# Patient Record
Sex: Female | Born: 1985 | Race: White | Hispanic: No | Marital: Single | Smoking: Current every day smoker
Health system: Southern US, Community
[De-identification: ages and names within clinical notes are randomized; demographics above are authoritative.]

## PROBLEM LIST (undated history)

## (undated) ENCOUNTER — Ambulatory Visit: Admission: EM | Payer: Self-pay

---
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
## 2010-06-07 ENCOUNTER — Emergency Department (HOSPITAL_BASED_OUTPATIENT_CLINIC_OR_DEPARTMENT_OTHER)
Admission: EM | Admit: 2010-06-07 | Discharge: 2010-06-08 | Payer: Self-pay | Source: Home / Self Care | Admitting: Emergency Medicine

## 2011-02-13 NOTE — Discharge Summary (Signed)
[facility]  Patient:    Krystal Lambert, Krystal Lambert                         MRN: [id number] Adm. Date:  [id number] Disc. Date: [id number] Attending:  Milford Cage H                           Discharge Summary  PATIENT IDENTIFICATION:  Krystal Lambert was a 25 year old female.  HISTORY OF PRESENT ILLNESS:  Krystal Lambert was admitted to the hospital after being suspended from school for swearing at her teacher, she said.  She had been mad at the teacher because she had been given a slip to go to the principals office.  She said all that she did was left at the time, when the teacher did not want her to be laughing.  If she went to the office, she would be suspended, she said.  So, she used the F-word, the B-word, and other words, and got suspended.  She said, looking back on it, she knew all that she should have done, was to keep her mouth shut, go to the principals office, and she might have been out of trouble, and if she were in trouble, she said, she should have just taken it, because she deserved it.  She said she had been getting into some trouble at home.  Things had been worse since her mother made her move from living close to her grandmothers house to living just with her.  MENTAL STATUS EXAMINATION:  At the time of the initial evaluation, revealed an alert and oriented, young woman, who was cooperative.  She denied any suicidal thoughts or threats towards anyone else.  There was no evidence of any thought disorder.  Short and long-term memory were intact.  Judgement seemed adequate. Insight was minimal.  Intellectual functioning seemed, at least, average. Concentration was adequate.  Other pertinent history can be obtained from the psychosocial service summary.  The physical examination was within normal limits.  ADMISSION DIAGNOSES: Axis I:    1. Mood disorder, not otherwise specified.            2. Oppositional defiant disorder. Axis II:   Deferred. Axis III:   Healthy. Axis IV:   Moderate. Axis V:    50/55.  FINDINGS:  All indicated laboratory examinations were within normal limits or noncontributory.  HOSPITAL COURSE:  While in the hospital, Krystal Lambert was basically cooperative. She said problem had been her temper.  She knew she could control it if she worked at it.  She believes that she could work things out with her mother, if she made the effort to do it.  She did miss her grandmother, she said, because her grandmother was someone she could talk to and who seemed supportive to her.  From her standpoint, her mother always seemed busy with her own issues and her own life, and they clashed regularly.  She admitted that if she improved her own attitude, her mothers attitude might get better.  She did have a family session with her parents.  They let her know what they expected of her.  Her mother admitted to being moody, but was willing to work on her own moods at times.  The rules of returning to home were set up.  Krystal Lambert said she believed she could follow them, and she would make an effort to get along, both at home and school, because it was  not worth the trouble.  Consequently, she was discharged.  CONDITION AT DISCHARGE:  She was denying any threats towards herself or anyone else, and indicating that she would follow the rules and stay out of trouble at home and school.  POST HOSPITAL CARE PLAN:  She was referred to Rolan Lipa, at the [facility] in [place], with an appointment for May 3. There were no medications used in the hospital, nor were any prescribed at the time of discharge.  There were no restrictions placed on her activity or her diet.  FINAL DIAGNOSIS: Axis I:    1. Mood disorder, not otherwise specified.            2. Oppositional defiant disorder. Axis II:   No diagnosis. Axis III:  Healthy. Axis IV:   Moderate. Axis V:    50. DD:  02/04/00 TD:  02/06/00 Job: [id number]

## 2011-10-21 ENCOUNTER — Emergency Department (HOSPITAL_BASED_OUTPATIENT_CLINIC_OR_DEPARTMENT_OTHER)
Admission: EM | Admit: 2011-10-21 | Discharge: 2011-10-21 | Disposition: A | Discharge status: Home / Self Care | Payer: Medicaid Other | Attending: Emergency Medicine | Admitting: Emergency Medicine

## 2011-10-21 ENCOUNTER — Encounter (HOSPITAL_BASED_OUTPATIENT_CLINIC_OR_DEPARTMENT_OTHER): Payer: Self-pay | Admitting: *Deleted

## 2011-10-21 DIAGNOSIS — K029 Dental caries, unspecified: Secondary | ICD-10-CM | POA: Insufficient documentation

## 2011-10-21 DIAGNOSIS — F17200 Nicotine dependence, unspecified, uncomplicated: Secondary | ICD-10-CM | POA: Insufficient documentation

## 2011-10-21 DIAGNOSIS — K089 Disorder of teeth and supporting structures, unspecified: Secondary | ICD-10-CM | POA: Insufficient documentation

## 2011-10-21 DIAGNOSIS — K0889 Other specified disorders of teeth and supporting structures: Secondary | ICD-10-CM

## 2011-10-21 MED ORDER — OXYCODONE-ACETAMINOPHEN 5-325 MG PO TABS
1.0000 | ORAL_TABLET | Freq: Once | ORAL | Status: AC
  Administered 2011-10-21: 1 via ORAL
  Filled 2011-10-21: qty 1

## 2011-10-21 MED ORDER — OXYCODONE-ACETAMINOPHEN 5-325 MG PO TABS: 2.0000 | ORAL_TABLET | ORAL | Status: AC | PRN

## 2011-10-21 MED ORDER — AMOXICILLIN 500 MG PO CAPS: 500.0000 mg | ORAL_CAPSULE | Freq: Three times a day (TID) | ORAL | Status: AC

## 2011-10-21 NOTE — ED Provider Notes (Signed)
History     CSN: [id number]  Arrival date & time 10/21/11  2137   First MD Initiated Contact with Patient 10/21/11 2203      Chief Complaint  Patient presents with  . Dental Pain   patient has been having a toothache in the right lower molar over the past week. She's had no fevers no drainage. Denies any facial swelling. States she's trying to get into see a dentist. She denies any other symptoms at this time  (Consider location/radiation/quality/duration/timing/severity/associated sxs/prior treatment) HPI  History reviewed. No pertinent past medical history.  Past Surgical History  Procedure Date  . Cesarean section     History reviewed. No pertinent family history.  History  Substance Use Topics  . Smoking status: Current Everyday Smoker  . Smokeless tobacco: Not on file  . Alcohol Use: No    OB History    Grav Para Term Preterm Abortions TAB SAB Ect Mult Living                  Review of Systems  All other systems reviewed and are negative.    Allergies  Morphine and related  Home Medications   Current Outpatient Rx  Name Route Sig Dispense Refill  . ACETAMINOPHEN 500 MG PO TABS Oral Take 1,000 mg by mouth every 6 (six) hours as needed. For pain    . IBUPROFEN 200 MG PO TABS Oral Take 200 mg by mouth 2 (two) times daily. For pain    . AMOXICILLIN 500 MG PO CAPS Oral Take 1 capsule (500 mg total) by mouth 3 (three) times daily. 21 capsule 0  . OXYCODONE-ACETAMINOPHEN 5-325 MG PO TABS Oral Take 2 tablets by mouth every 4 (four) hours as needed for pain. 15 tablet 0    BP 135/66  Pulse 89  Temp(Src) 97.9 F (36.6 C) (Oral)  Resp 16  Ht 5\' 5"  (1.651 m)  Wt 150 lb (68.04 kg)  BMI 24.96 kg/m2  SpO2 100%  LMP 10/07/2011  Physical Exam  Nursing note and vitals reviewed. Constitutional: She appears well-developed and well-nourished. No distress.  HENT:  Head: Normocephalic.  Mouth/Throat: No oropharyngeal exudate.       Dental caries in right lower  molar. No surrounding erythema or redness. No abscess. There is no abnormal odor  Eyes: Pupils are equal, round, and reactive to light.  Cardiovascular: Normal heart sounds.   Pulmonary/Chest: Breath sounds normal.  Abdominal: Soft.  Musculoskeletal: Normal range of motion.  Lymphadenopathy:    She has no cervical adenopathy.  Neurological: She is alert.  Skin: Skin is warm and dry.    ED Course  Procedures (including critical care time)  Labs Reviewed - No data to display No results found.   1. Pain, dental   2. Dental caries       MDM  Patient is seen and examined, initial history and physical is completed. Evaluation initiated        Caelin Rosen A. Patrica Duel, MD 10/21/11 2215

## 2011-10-21 NOTE — ED Notes (Signed)
Pt c/o right lower tooth ache 

## 2012-03-20 ENCOUNTER — Emergency Department (HOSPITAL_BASED_OUTPATIENT_CLINIC_OR_DEPARTMENT_OTHER)
Admission: EM | Admit: 2012-03-20 | Discharge: 2012-03-20 | Disposition: A | Discharge status: Home / Self Care | Payer: Medicaid Other | Attending: Emergency Medicine | Admitting: Emergency Medicine

## 2012-03-20 ENCOUNTER — Emergency Department (HOSPITAL_BASED_OUTPATIENT_CLINIC_OR_DEPARTMENT_OTHER): Payer: Medicaid Other

## 2012-03-20 ENCOUNTER — Encounter (HOSPITAL_BASED_OUTPATIENT_CLINIC_OR_DEPARTMENT_OTHER): Payer: Self-pay | Admitting: *Deleted

## 2012-03-20 DIAGNOSIS — F17200 Nicotine dependence, unspecified, uncomplicated: Secondary | ICD-10-CM | POA: Insufficient documentation

## 2012-03-20 DIAGNOSIS — Z885 Allergy status to narcotic agent status: Secondary | ICD-10-CM | POA: Insufficient documentation

## 2012-03-20 DIAGNOSIS — R079 Chest pain, unspecified: Secondary | ICD-10-CM | POA: Insufficient documentation

## 2012-03-20 DIAGNOSIS — R0602 Shortness of breath: Secondary | ICD-10-CM | POA: Insufficient documentation

## 2012-03-20 MED ORDER — NAPROXEN 500 MG PO TABS: 500.0000 mg | ORAL_TABLET | Freq: Two times a day (BID) | ORAL | Status: AC

## 2012-03-20 NOTE — ED Notes (Signed)
Pt states she has had intermittent episodes of CP and SHOB over the past 2 weeks. Her father told her it was due to the window units in the house "they can put moisture in your lungs". No distress

## 2012-03-20 NOTE — ED Provider Notes (Signed)
History   This chart was scribed for Rolan Bucco, MD by Charolett Bumpers . The patient was seen in room MH04/MH04.    CSN: [id number]  Arrival date & time 03/20/12  2128   First MD Initiated Contact with Patient 03/20/12 2158      Chief Complaint  Patient presents with  . Chest Pain    (Consider location/radiation/quality/duration/timing/severity/associated sxs/prior treatment) HPI Karoline Lazalde is a 26 y.o. female who presents to the Emergency Department complaining of intermittent, moderate chest pain with associated mild SOB over the past 2 weeks. Patient states that her chest pain is all the way across her chest as well as across her upper back. Patient states that her chest pain is sharp. Patient states that her symptoms are aggravated with movement, sometimes breathing and sitting up. Patient states that the chest pain feels like a pressure in the front, and states the sharp pain are located in her back. Patient denies any recent injuries or travel. Patient denies any h/o heart problems in past. Patient reports no family h/o heart problems. Is concerned that she might have gotten some infection from the air conditioner.  No leg pain or swelling.  No cough or recent illnesses  History reviewed. No pertinent past medical history.  Past Surgical History  Procedure Date  . Cesarean section     History reviewed. No pertinent family history.  History  Substance Use Topics  . Smoking status: Current Everyday Smoker  . Smokeless tobacco: Not on file  . Alcohol Use: No    OB History    Grav Para Term Preterm Abortions TAB SAB Ect Mult Living                  Review of Systems  Constitutional: Negative for fever.  HENT: Negative for congestion, sore throat and rhinorrhea.   Respiratory: Positive for shortness of breath.   Cardiovascular: Positive for chest pain. Negative for leg swelling.  Gastrointestinal: Negative for nausea, vomiting, abdominal pain and diarrhea.    All other systems reviewed and are negative.    Allergies  Morphine and related  Home Medications   Current Outpatient Rx  Name Route Sig Dispense Refill  . ACETAMINOPHEN 500 MG PO TABS Oral Take 1,000 mg by mouth every 6 (six) hours as needed. For pain    . IBUPROFEN 200 MG PO TABS Oral Take 200 mg by mouth 2 (two) times daily. For pain    . NAPROXEN 500 MG PO TABS Oral Take 1 tablet (500 mg total) by mouth 2 (two) times daily. 30 tablet 0    BP 144/93  Pulse 86  Temp 98 F (36.7 C) (Oral)  Resp 20  Ht 5\' 5"  (1.651 m)  Wt 204 lb 2 oz (92.59 kg)  BMI 33.97 kg/m2  SpO2 99%  LMP 03/20/2012  Physical Exam  Nursing note and vitals reviewed. Constitutional: She is oriented to person, place, and time. She appears well-developed and well-nourished. No distress.  HENT:  Head: Normocephalic and atraumatic.  Mouth/Throat: Oropharynx is clear and moist.  Eyes: EOM are normal. Pupils are equal, round, and reactive to light.  Neck: Normal range of motion. Neck supple. No tracheal deviation present.  Cardiovascular: Normal rate, regular rhythm and normal heart sounds.   Pulmonary/Chest: Effort normal and breath sounds normal. No respiratory distress. She exhibits tenderness.       Mild reproducible lower chest wall tenderness.  Abdominal: Soft. Bowel sounds are normal. She exhibits no distension. There is no  tenderness.  Musculoskeletal: Normal range of motion. She exhibits no edema and no tenderness.       Mild reproducible upper back tenderness.   Neurological: She is alert and oriented to person, place, and time. No cranial nerve deficit or sensory deficit. Coordination normal.  Skin: Skin is warm and dry.  Psychiatric: She has a normal mood and affect. Her behavior is normal.    ED Course  Procedures (including critical care time)  DIAGNOSTIC STUDIES: Oxygen Saturation is 99% on room air, normal by my interpretation.    COORDINATION OF CARE:  2225: Discussed planned  course of treatment with the patient, who is agreeable at this time.     Labs Reviewed - No data to display Dg Chest 2 View  03/20/2012  *RADIOLOGY REPORT*  Clinical Data: 26 year old female chest pain radiating to the back, shortness of breath.  CHEST - 2 VIEW  Comparison: None.  Findings: Cardiac size at the upper limits of normal. Other mediastinal contours are within normal limits.  Visualized tracheal air column is within normal limits.  The lungs are clear.  No pneumothorax or effusion. No osseous abnormality identified.  IMPRESSION: Negative, no acute cardiopulmonary abnormality.  Original Report Authenticated By: Harley Hallmark, M.D.    Date: 03/20/2012  Rate: 77  Rhythm: normal sinus rhythm  QRS Axis: normal  Intervals: normal  ST/T Wave abnormalities: normal  Conduction Disutrbances:none  Narrative Interpretation:   Old EKG Reviewed: none available    1. Chest pain       MDM  Pt with reproducible chest/back pain.  No FH of heart disease.  Doubt cardiac.  Nothing to suggest arrhythmia.  No other symptoms to suggest PE.  Likely musculoskeletal.  Will d/c to f/u with PMD or return for any worsening symptoms  I personally performed the services described in this documentation, which was scribed in my presence.  The recorded information has been reviewed and considered.        Rolan Bucco, MD 03/20/12 2257

## 2012-03-20 NOTE — ED Notes (Signed)
Pt reports generalized chest pain, although pt is unable to describe pain or location.  She reports shortness of breath with exertion, denies any N/V, or diaphoresis.  Pt denies any recent travel. Pt denies any recent fever or cough.  Pt denies any recent trauma.  Pt appears in no apparent distress.

## 2013-05-20 IMAGING — CR DG CHEST 2V
2 series · 2 of 2 positions shown · non-contrast
Comparison: None.

CLINICAL DATA: 26-year-old female chest pain radiating to the back,
shortness of breath.

CHEST - 2 VIEW

[w chest pa]
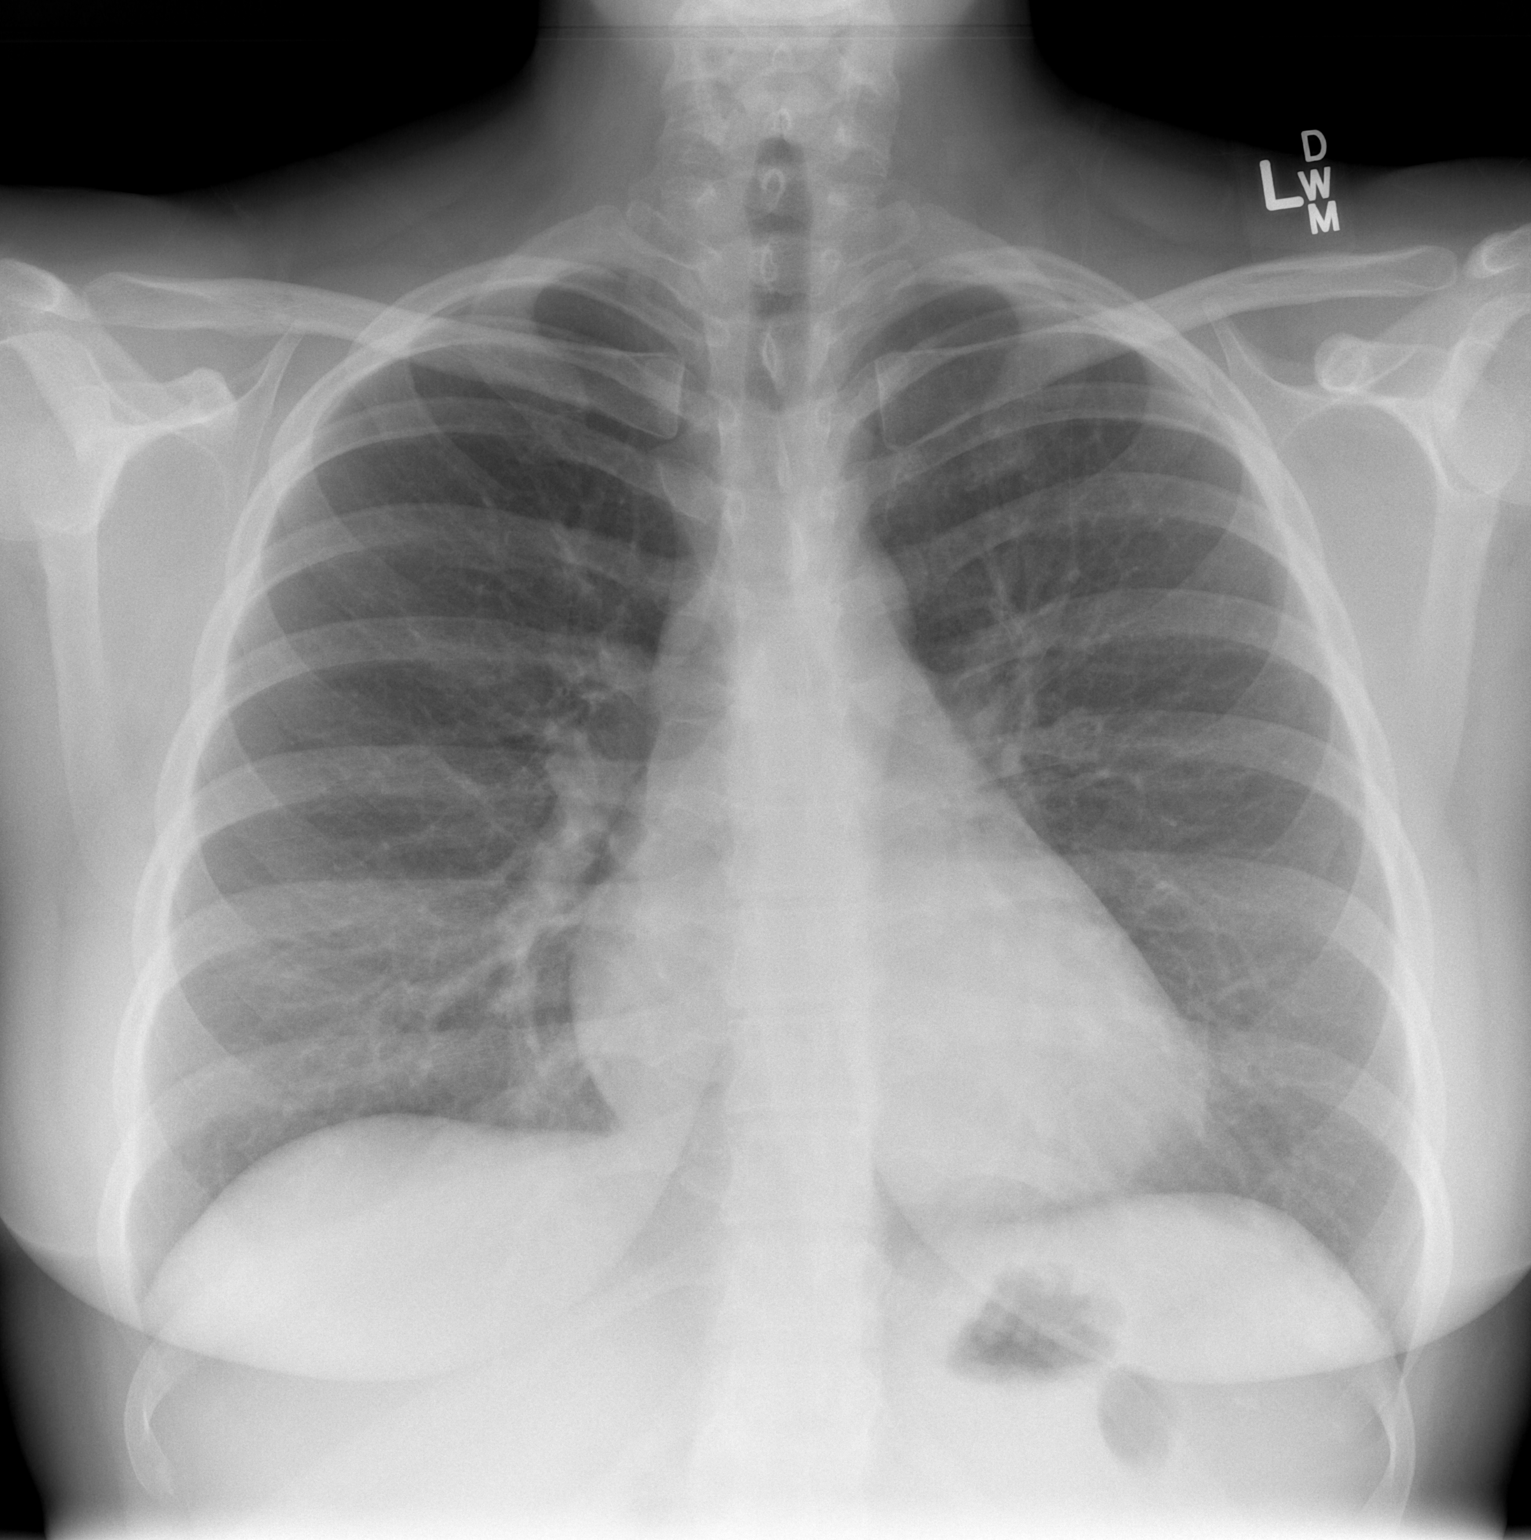

[w chest lat]
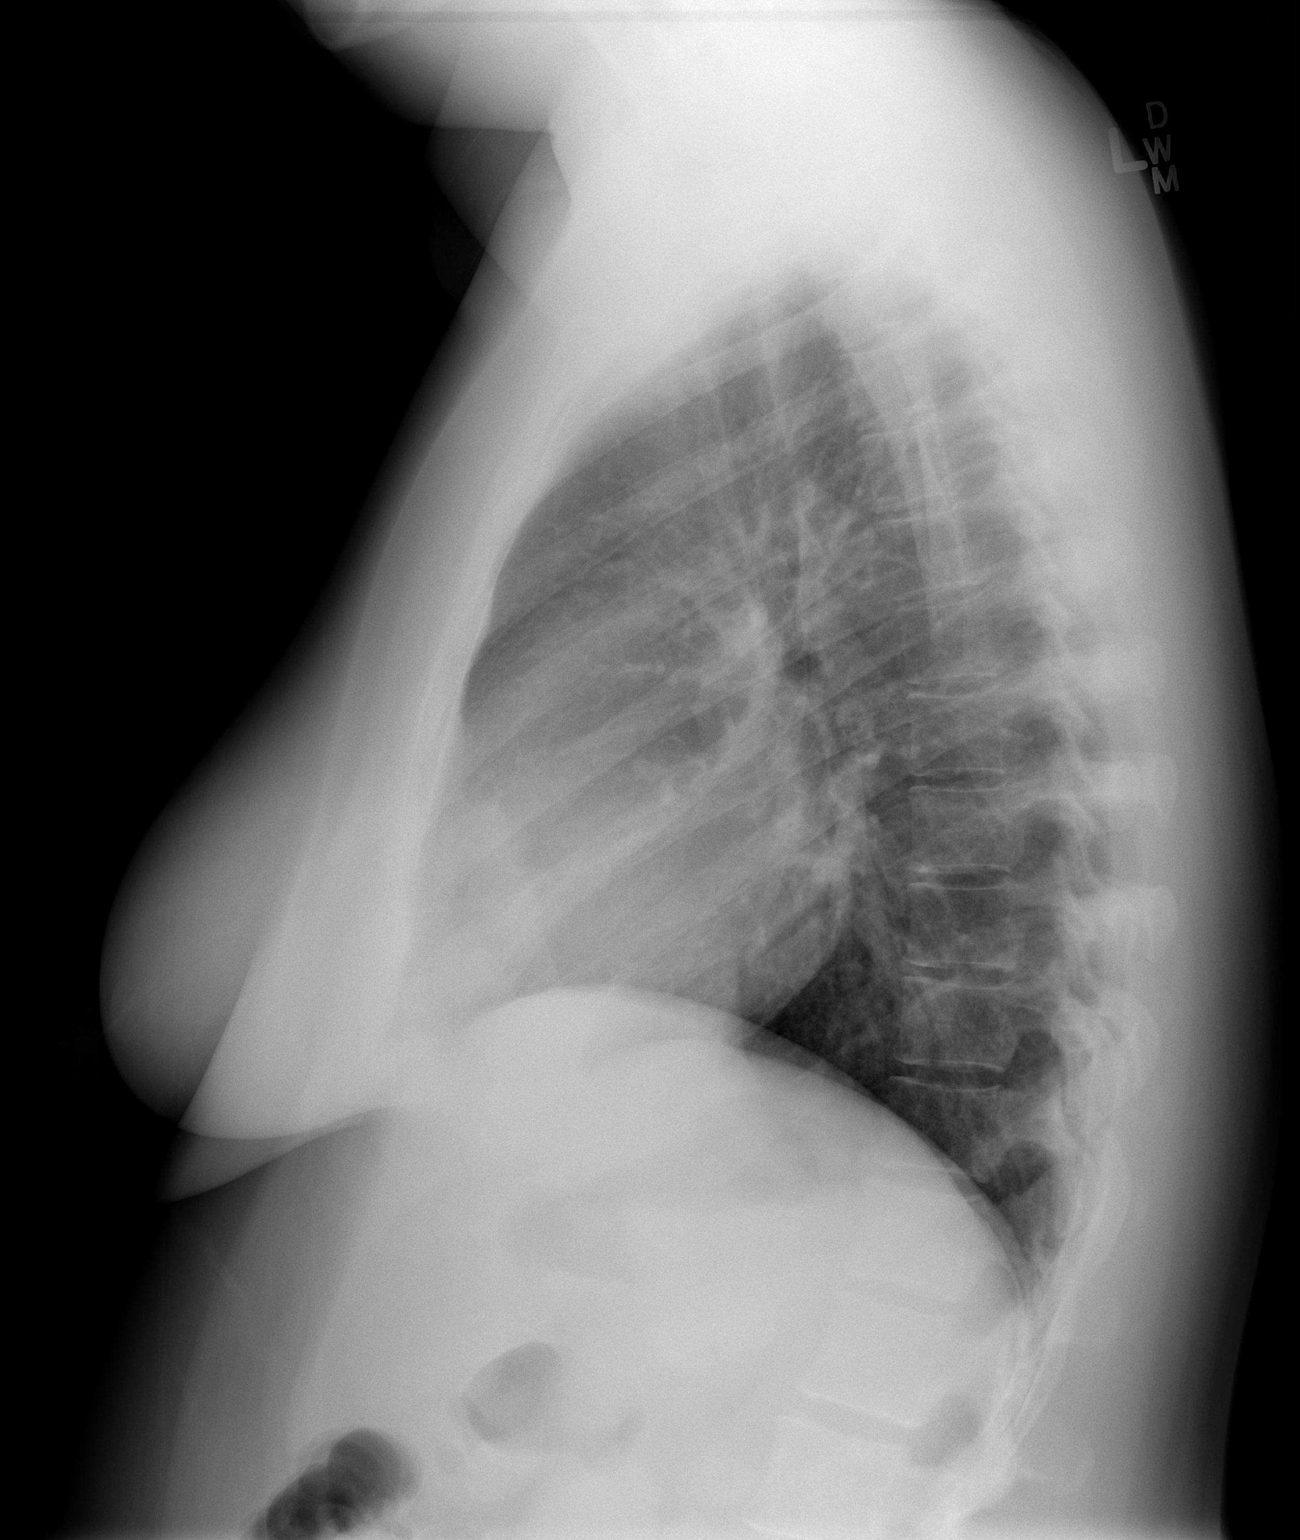

[2 of 2 positions shown; findings below may reference images not displayed]

FINDINGS: Cardiac size at the upper limits of normal. Other
mediastinal contours are within normal limits.  Visualized tracheal
air column is within normal limits.  The lungs are clear.  No
pneumothorax or effusion. No osseous abnormality identified.
IMPRESSION: Negative, no acute cardiopulmonary abnormality.

## 2014-09-05 ENCOUNTER — Emergency Department (HOSPITAL_BASED_OUTPATIENT_CLINIC_OR_DEPARTMENT_OTHER)
Admission: EM | Admit: 2014-09-05 | Discharge: 2014-09-06 | Disposition: A | Discharge status: Home / Self Care | Payer: Medicaid Other | Attending: Emergency Medicine | Admitting: Emergency Medicine

## 2014-09-05 ENCOUNTER — Encounter (HOSPITAL_BASED_OUTPATIENT_CLINIC_OR_DEPARTMENT_OTHER): Payer: Self-pay | Admitting: *Deleted

## 2014-09-05 DIAGNOSIS — K006 Disturbances in tooth eruption: Secondary | ICD-10-CM | POA: Diagnosis not present

## 2014-09-05 DIAGNOSIS — Z791 Long term (current) use of non-steroidal anti-inflammatories (NSAID): Secondary | ICD-10-CM | POA: Diagnosis not present

## 2014-09-05 DIAGNOSIS — R110 Nausea: Secondary | ICD-10-CM | POA: Diagnosis not present

## 2014-09-05 DIAGNOSIS — K0889 Other specified disorders of teeth and supporting structures: Secondary | ICD-10-CM

## 2014-09-05 DIAGNOSIS — R42 Dizziness and giddiness: Secondary | ICD-10-CM | POA: Insufficient documentation

## 2014-09-05 DIAGNOSIS — R51 Headache: Secondary | ICD-10-CM | POA: Diagnosis not present

## 2014-09-05 DIAGNOSIS — K088 Other specified disorders of teeth and supporting structures: Secondary | ICD-10-CM | POA: Insufficient documentation

## 2014-09-05 DIAGNOSIS — G44209 Tension-type headache, unspecified, not intractable: Secondary | ICD-10-CM

## 2014-09-05 DIAGNOSIS — Z3202 Encounter for pregnancy test, result negative: Secondary | ICD-10-CM | POA: Diagnosis not present

## 2014-09-05 MED ORDER — SODIUM CHLORIDE 0.9 % IV SOLN
1000.0000 mL | INTRAVENOUS | Status: DC
  Administered 2014-09-06: 1000 mL via INTRAVENOUS

## 2014-09-05 MED ORDER — METOCLOPRAMIDE HCL 5 MG/ML IJ SOLN
10.0000 mg | Freq: Once | INTRAMUSCULAR | Status: AC
  Administered 2014-09-05: 10 mg via INTRAVENOUS
  Filled 2014-09-05: qty 2

## 2014-09-05 MED ORDER — SODIUM CHLORIDE 0.9 % IV SOLN
1000.0000 mL | Freq: Once | INTRAVENOUS | Status: AC
  Administered 2014-09-05: 1000 mL via INTRAVENOUS

## 2014-09-05 MED ORDER — DIPHENHYDRAMINE HCL 50 MG/ML IJ SOLN
25.0000 mg | Freq: Once | INTRAMUSCULAR | Status: AC
  Administered 2014-09-05: 25 mg via INTRAVENOUS
  Filled 2014-09-05: qty 1

## 2014-09-05 MED ORDER — KETOROLAC TROMETHAMINE 30 MG/ML IJ SOLN
30.0000 mg | Freq: Once | INTRAMUSCULAR | Status: AC
  Administered 2014-09-05: 30 mg via INTRAVENOUS
  Filled 2014-09-05: qty 1

## 2014-09-05 NOTE — ED Provider Notes (Signed)
CSN: [id number]     Arrival date & time 09/05/14  2236 History  This chart was scribed for Dione Boozeavid Nelson Noone, MD by Richarda Overlieichard Holland, ED Scribe. This patient was seen in room MH10/MH10 and the patient's care was started 11:27 PM.    Chief Complaint  Patient presents with  . Headache   Patient is a 28 y.o. female presenting with headaches. The history is provided by the patient. No language interpreter was used.  Headache Associated symptoms: dizziness and nausea   Associated symptoms: no numbness and no vomiting    HPI Comments: Jerrye Nobleiffany Bents is a 28 y.o. female who presents to the Emergency Department complaining of a severe, genrealized HA. She describes the pain as throbbing and rates her pain as a 8/10 at this time. Pt states that the HA started at the based of her neck. She reports associated nausea currently. She states when she moves it worsens her HA. She states she went to the hospital in [place] for a possible infected tooth which she thinks may be attributing to her HA. Pt reports she was prescribed amoxicillin and norco at her visit. She states that her tooth started bothering her about 3 days ago.  She reports she has taken no medication for her HA today. Pt reports 1 prior similar HA. She denies any vision changes, vomiting and numbness.   History reviewed. No pertinent past medical history. Past Surgical History  Procedure Laterality Date  . Cesarean section     History reviewed. No pertinent family history. History  Substance Use Topics  . Smoking status: Current Every Day Smoker  . Smokeless tobacco: Not on file  . Alcohol Use: No   OB History    No data available     Review of Systems  HENT: Positive for dental problem.   Eyes: Negative for visual disturbance.  Gastrointestinal: Positive for nausea. Negative for vomiting.  Neurological: Positive for dizziness and headaches. Negative for numbness.  All other systems reviewed and are negative.   Allergies  Morphine  and related  Home Medications   Prior to Admission medications   Medication Sig Start Date End Date Taking? Authorizing Provider  acetaminophen (TYLENOL) 500 MG tablet Take 1,000 mg by mouth every 6 (six) hours as needed. For pain    Historical Provider, MD  ibuprofen (ADVIL,MOTRIN) 200 MG tablet Take 200 mg by mouth 2 (two) times daily. For pain    Historical Provider, MD   BP 114/59 mmHg  Pulse 110  Temp(Src) 98.8 F (37.1 C) (Oral)  Resp 18  Ht 5\' 5"  (1.651 m)  Wt 180 lb (81.647 kg)  BMI 29.95 kg/m2  SpO2 99%  LMP 09/05/2014 Physical Exam  Constitutional: She is oriented to person, place, and time. She appears well-developed and well-nourished.  HENT:  Head: Normocephalic and atraumatic.  Generally poor dentition. Tooth number 32 partly impacted and tender to percussion.   Eyes: Pupils are equal, round, and reactive to light. Right eye exhibits no discharge. Left eye exhibits no discharge.  Fundi are normal.   Neck: Normal range of motion. Neck supple. No JVD present.  Cardiovascular: Normal rate, regular rhythm and normal heart sounds.   No murmur heard. Pulmonary/Chest: Effort normal and breath sounds normal. She has no wheezes. She has no rales. She exhibits no tenderness.  Abdominal: Soft. Bowel sounds are normal. She exhibits no distension and no mass. There is no tenderness.  Musculoskeletal: Normal range of motion. She exhibits no edema.  Lymphadenopathy:  She has no cervical adenopathy.  Neurological: She is alert and oriented to person, place, and time. She has normal reflexes. No cranial nerve deficit. Coordination normal.  Skin: Skin is warm and dry. No rash noted.  Psychiatric: She has a normal mood and affect. Her behavior is normal. Thought content normal.  Nursing note and vitals reviewed.   ED Course  Procedures   DIAGNOSTIC STUDIES: Oxygen Saturation is 99% on RA, normal by my interpretation.    COORDINATION OF CARE: 11:38 PM Discussed treatment  plan with pt at bedside and pt agreed to plan.  MDM   Final diagnoses:  Muscle contraction headache  Pain, dental    Headache is seems to be a muscle contraction headache. She will be given a headache cocktail. Dental pain related to impacted and probably carious tooth #32.  After headache cocktail, she felt much better. She is referred to on-call dentist for follow-up of her pain and she is given a prescription for metoclopramide.   I personally performed the services described in this documentation, which was scribed in my presence. The recorded information has been reviewed and is accurate.       Dione Boozeavid Nysha Koplin, MD 09/06/14 [phone]

## 2014-09-05 NOTE — ED Notes (Signed)
HA that started this afternoon.  States that she was at another ER this morning for dental pain.

## 2014-09-05 NOTE — ED Notes (Signed)
C/o ha onset this pm,slight nausea,   Was seen this am in [place] this am for infected tooth

## 2014-09-06 MED ORDER — HYDROCODONE-ACETAMINOPHEN 5-325 MG PO TABS
1.0000 | ORAL_TABLET | Freq: Once | ORAL | Status: AC
  Administered 2014-09-06: 1 via ORAL
  Filled 2014-09-06: qty 1

## 2014-09-06 MED ORDER — METOCLOPRAMIDE HCL 10 MG PO TABS: 10.0000 mg | ORAL_TABLET | Freq: Four times a day (QID) | ORAL | Status: AC | PRN

## 2014-09-06 NOTE — Discharge Instructions (Signed)
Tension Headache A tension headache is a feeling of pain, pressure, or aching often felt over the front and sides of the head. The pain can be dull or can feel tight (constricting). It is the most common type of headache. Tension headaches are not normally associated with nausea or vomiting and do not get worse with physical activity. Tension headaches can last 30 minutes to several days.  CAUSES  The exact cause is not known, but it may be caused by chemicals and hormones in the brain that lead to pain. Tension headaches often begin after stress, anxiety, or depression. Other triggers may include:  Alcohol.  Caffeine (too much or withdrawal).  Respiratory infections (colds, flu, sinus infections).  Dental problems or teeth clenching.  Fatigue.  Holding your head and neck in one position too long while using a computer. SYMPTOMS   Pressure around the head.   Dull, aching head pain.   Pain felt over the front and sides of the head.   Tenderness in the muscles of the head, neck, and shoulders. DIAGNOSIS  A tension headache is often diagnosed based on:   Symptoms.   Physical examination.   A CT scan or MRI of your head. These tests may be ordered if symptoms are severe or unusual. TREATMENT  Medicines may be given to help relieve symptoms.  HOME CARE INSTRUCTIONS   Only take over-the-counter or prescription medicines for pain or discomfort as directed by your caregiver.   Lie down in a dark, quiet room when you have a headache.   Keep a journal to find out what may be triggering your headaches. For example, write down:  What you eat and drink.  How much sleep you get.  Any change to your diet or medicines.  Try massage or other relaxation techniques.   Ice packs or heat applied to the head and neck can be used. Use these 3 to 4 times per day for 15 to 20 minutes each time, or as needed.   Limit stress.   Sit up straight, and do not tense your muscles.    Quit smoking if you smoke.  Limit alcohol use.  Decrease the amount of caffeine you drink, or stop drinking caffeine.  Eat and exercise regularly.  Get 7 to 9 hours of sleep, or as recommended by your caregiver.  Avoid excessive use of pain medicine as recurrent headaches can occur.  SEEK MEDICAL CARE IF:   You have problems with the medicines you were prescribed.  Your medicines do not work.  You have a change from the usual headache.  You have nausea or vomiting. SEEK IMMEDIATE MEDICAL CARE IF:   Your headache becomes severe.  You have a fever.  You have a stiff neck.  You have loss of vision.  You have muscular weakness or loss of muscle control.  You lose your balance or have trouble walking.  You feel faint or pass out.  You have severe symptoms that are different from your first symptoms. MAKE SURE YOU:   Understand these instructions.  Will watch your condition.  Will get help right away if you are not doing well or get worse. Document Released: 09/14/2005 Document Revised: 12/07/2011 Document Reviewed: 09/04/2011 [facility] Patient Information 2015 [place], [place]. This information is not intended to replace advice given to you by your health care provider. Make sure you discuss any questions you have with your health care provider.  Dental Pain A tooth ache may be caused by cavities (tooth decay).  Cavities expose the nerve of the tooth to air and hot or cold temperatures. It may come from an infection or abscess (also called a boil or furuncle) around your tooth. It is also often caused by dental caries (tooth decay). This causes the pain you are having. DIAGNOSIS  Your caregiver can diagnose this problem by exam. TREATMENT   If caused by an infection, it may be treated with medications which kill germs (antibiotics) and pain medications as prescribed by your caregiver. Take medications as directed.  Only take over-the-counter or prescription  medicines for pain, discomfort, or fever as directed by your caregiver.  Whether the tooth ache today is caused by infection or dental disease, you should see your dentist as soon as possible for further care. SEEK MEDICAL CARE IF: The exam and treatment you received today has been provided on an emergency basis only. This is not a substitute for complete medical or dental care. If your problem worsens or new problems (symptoms) appear, and you are unable to meet with your dentist, call or return to this location. SEEK IMMEDIATE MEDICAL CARE IF:   You have a fever.  You develop redness and swelling of your face, jaw, or neck.  You are unable to open your mouth.  You have severe pain uncontrolled by pain medicine. MAKE SURE YOU:   Understand these instructions.  Will watch your condition.  Will get help right away if you are not doing well or get worse. Document Released: 09/14/2005 Document Revised: 12/07/2011 Document Reviewed: 05/02/2008 [facility] Patient Information 2015 [place], [place]. This information is not intended to replace advice given to you by your health care provider. Make sure you discuss any questions you have with your health care provider.  Metoclopramide tablets What is this medicine? METOCLOPRAMIDE (met oh kloe PRA mide) is used to treat the symptoms of gastroesophageal reflux disease (GERD) like heartburn. It is also used to treat people with slow emptying of the stomach and intestinal tract. This medicine may be used for other purposes; ask your health care provider or pharmacist if you have questions. COMMON BRAND NAME(S): Reglan What should I tell my health care provider before I take this medicine? They need to know if you have any of these conditions: -breast cancer -depression -diabetes -heart failure -high blood pressure -kidney disease -liver disease -Parkinson's disease or a movement disorder -pheochromocytoma -seizures -stomach obstruction,  bleeding, or perforation -an unusual or allergic reaction to metoclopramide, procainamide, sulfites, other medicines, foods, dyes, or preservatives -pregnant or trying to get pregnant -breast-feeding How should I use this medicine? Take this medicine by mouth with a glass of water. Follow the directions on the prescription label. Take this medicine on an empty stomach, about 30 minutes before eating. Take your doses at regular intervals. Do not take your medicine more often than directed. Do not stop taking except on the advice of your doctor or health care professional. A special MedGuide will be given to you by the pharmacist with each prescription and refill. Be sure to read this information carefully each time. Talk to your pediatrician regarding the use of this medicine in children. Special care may be needed. Overdosage: If you think you have taken too much of this medicine contact a poison control center or emergency room at once. NOTE: This medicine is only for you. Do not share this medicine with others. What if I miss a dose? If you miss a dose, take it as soon as you can. If it is almost  time for your next dose, take only that dose. Do not take double or extra doses. What may interact with this medicine? -acetaminophen -cyclosporine -digoxin -medicines for blood pressure -medicines for diabetes, including insulin -medicines for hay fever and other allergies -medicines for depression, especially an Monoamine Oxidase Inhibitor (MAOI) -medicines for Parkinson's disease, like levodopa -medicines for sleep or for pain -tetracycline This list may not describe all possible interactions. Give your health care provider a list of all the medicines, herbs, non-prescription drugs, or dietary supplements you use. Also tell them if you smoke, drink alcohol, or use illegal drugs. Some items may interact with your medicine. What should I watch for while using this medicine? It may take a few  weeks for your stomach condition to start to get better. However, do not take this medicine for longer than 12 weeks. The longer you take this medicine, and the more you take it, the greater your chances are of developing serious side effects. If you are an elderly patient, a female patient, or you have diabetes, you may be at an increased risk for side effects from this medicine. Contact your doctor immediately if you start having movements you cannot control such as lip smacking, rapid movements of the tongue, involuntary or uncontrollable movements of the eyes, head, arms and legs, or muscle twitches and spasms. Patients and their families should watch out for worsening depression or thoughts of suicide. Also watch out for any sudden or severe changes in feelings such as feeling anxious, agitated, panicky, irritable, hostile, aggressive, impulsive, severely restless, overly excited and hyperactive, or not being able to sleep. If this happens, especially at the beginning of treatment or after a change in dose, call your doctor. Do not treat yourself for high fever. Ask your doctor or health care professional for advice. You may get drowsy or dizzy. Do not drive, use machinery, or do anything that needs mental alertness until you know how this drug affects you. Do not stand or sit up quickly, especially if you are an older patient. This reduces the risk of dizzy or fainting spells. Alcohol can make you more drowsy and dizzy. Avoid alcoholic drinks. What side effects may I notice from receiving this medicine? Side effects that you should report to your doctor or health care professional as soon as possible: -allergic reactions like skin rash, itching or hives, swelling of the face, lips, or tongue -abnormal production of milk in females -breast enlargement in both males and females -change in the way you walk -difficulty moving, speaking or swallowing -drooling, lip smacking, or rapid movements of the  tongue -excessive sweating -fever -involuntary or uncontrollable movements of the eyes, head, arms and legs -irregular heartbeat or palpitations -muscle twitches and spasms -unusually weak or tired Side effects that usually do not require medical attention (report to your doctor or health care professional if they continue or are bothersome): -change in sex drive or performance -depressed mood -diarrhea -difficulty sleeping -headache -menstrual changes -restless or nervous This list may not describe all possible side effects. Call your doctor for medical advice about side effects. You may report side effects to FDA at 1-800-FDA-1088. Where should I keep my medicine? Keep out of the reach of children. Store at room temperature between 20 and 25 degrees C (68 and 77 degrees F). Protect from light. Keep container tightly closed. Throw away any unused medicine after the expiration date. NOTE: This sheet is a summary. It may not cover all possible information. If you have  questions about this medicine, talk to your doctor, pharmacist, or health care provider.  2015, [employer]. (2012-01-12 13:04:38)
